# Patient Record
Sex: Male | Born: 1985 | Race: Black or African American | Hispanic: No | Marital: Single | Smoking: Current every day smoker
Health system: Southern US, Community
[De-identification: ages and names within clinical notes are randomized; demographics above are authoritative.]

## PROBLEM LIST (undated history)

## (undated) DIAGNOSIS — D573 Sickle-cell trait: Secondary | ICD-10-CM

---

## 2004-01-29 ENCOUNTER — Other Ambulatory Visit: Payer: Self-pay

## 2004-12-10 ENCOUNTER — Emergency Department: Payer: Self-pay | Admitting: Unknown Physician Specialty

## 2005-01-07 ENCOUNTER — Emergency Department: Payer: Self-pay | Admitting: Emergency Medicine

## 2005-01-14 ENCOUNTER — Emergency Department: Payer: Self-pay | Admitting: Emergency Medicine

## 2005-06-06 ENCOUNTER — Inpatient Hospital Stay: Payer: Self-pay | Admitting: Psychiatry

## 2009-03-30 ENCOUNTER — Emergency Department: Payer: Self-pay | Admitting: Emergency Medicine

## 2009-11-26 ENCOUNTER — Emergency Department: Payer: Self-pay | Admitting: Emergency Medicine

## 2013-01-15 ENCOUNTER — Emergency Department: Payer: Self-pay | Admitting: Emergency Medicine

## 2013-01-15 LAB — COMPREHENSIVE METABOLIC PANEL
Alkaline Phosphatase: 74 U/L (ref 50–136)
Anion Gap: 5 — ABNORMAL LOW (ref 7–16)
BUN: 12 mg/dL (ref 7–18)
Bilirubin,Total: 0.4 mg/dL (ref 0.2–1.0)
Calcium, Total: 8.8 mg/dL (ref 8.5–10.1)
Creatinine: 0.83 mg/dL (ref 0.60–1.30)
EGFR (African American): >60
EGFR (Non-African Amer.): >60
Glucose: 96 mg/dL (ref 65–99)
SGOT(AST): 99 U/L — ABNORMAL HIGH (ref 15–37)
Sodium: 141 mmol/L (ref 136–145)
Total Protein: 6.6 g/dL (ref 6.4–8.2)

## 2013-01-15 LAB — CBC
HCT: 37.2 % — ABNORMAL LOW (ref 40.0–52.0)
RDW: 13.8 % (ref 11.5–14.5)
WBC: 8.1 10*3/uL (ref 3.8–10.6)

## 2013-01-15 LAB — ETHANOL: Ethanol: <3 mg/dL

## 2013-01-16 LAB — URINALYSIS, COMPLETE
Bacteria: NONE SEEN
Bilirubin,UR: NEGATIVE
Blood: NEGATIVE
Glucose,UR: NEGATIVE mg/dL (ref 0–75)
Ketone: NEGATIVE
Leukocyte Esterase: NEGATIVE
Ph: 6 (ref 4.5–8.0)
Protein: NEGATIVE
Squamous Epithelial: NONE SEEN
WBC UR: <1 /HPF (ref 0–5)

## 2013-01-16 LAB — DRUG SCREEN, URINE
Amphetamines, Ur Screen: NEGATIVE (ref ?–1000)
Barbiturates, Ur Screen: NEGATIVE (ref ?–200)
Benzodiazepine, Ur Scrn: NEGATIVE (ref ?–200)
Cannabinoid 50 Ng, Ur ~~LOC~~: POSITIVE (ref ?–50)
Opiate, Ur Screen: NEGATIVE (ref ?–300)
Tricyclic, Ur Screen: NEGATIVE (ref ?–1000)

## 2013-02-07 LAB — CBC
HCT: 44.8 % (ref 40.0–52.0)
MCH: 29.6 pg (ref 26.0–34.0)
Platelet: 256 10*3/uL (ref 150–440)
RDW: 14.4 % (ref 11.5–14.5)
WBC: 12.8 10*3/uL — ABNORMAL HIGH (ref 3.8–10.6)

## 2013-02-07 LAB — COMPREHENSIVE METABOLIC PANEL
Albumin: 4.6 g/dL (ref 3.4–5.0)
Alkaline Phosphatase: 91 U/L (ref 50–136)
Anion Gap: 3 — ABNORMAL LOW (ref 7–16)
BUN: 10 mg/dL (ref 7–18)
Chloride: 107 mmol/L (ref 98–107)
Co2: 29 mmol/L (ref 21–32)
Creatinine: 1 mg/dL (ref 0.60–1.30)
EGFR (African American): >60
Glucose: 83 mg/dL (ref 65–99)
Potassium: 3.9 mmol/L (ref 3.5–5.1)
SGOT(AST): 38 U/L — ABNORMAL HIGH (ref 15–37)
SGPT (ALT): 33 U/L (ref 12–78)
Sodium: 139 mmol/L (ref 136–145)
Total Protein: 7.9 g/dL (ref 6.4–8.2)

## 2013-02-07 LAB — ETHANOL: Ethanol %: <0.003 % (ref 0.000–0.080)

## 2013-02-08 LAB — DRUG SCREEN, URINE
Amphetamines, Ur Screen: NEGATIVE (ref ?–1000)
Benzodiazepine, Ur Scrn: NEGATIVE (ref ?–200)
Cannabinoid 50 Ng, Ur ~~LOC~~: POSITIVE (ref ?–50)
Cocaine Metabolite,Ur ~~LOC~~: POSITIVE (ref ?–300)
MDMA (Ecstasy)Ur Screen: NEGATIVE (ref ?–500)
Tricyclic, Ur Screen: NEGATIVE (ref ?–1000)

## 2013-02-08 LAB — URINALYSIS, COMPLETE
Bacteria: NONE SEEN
Bilirubin,UR: NEGATIVE
Blood: NEGATIVE
Glucose,UR: NEGATIVE mg/dL (ref 0–75)
Ketone: NEGATIVE
Leukocyte Esterase: NEGATIVE
Nitrite: NEGATIVE
RBC,UR: <1 /HPF (ref 0–5)

## 2013-02-09 ENCOUNTER — Inpatient Hospital Stay: Payer: Self-pay | Admitting: Psychiatry
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
## 2017-08-01 ENCOUNTER — Encounter: Payer: Self-pay | Admitting: Emergency Medicine

## 2017-08-01 ENCOUNTER — Emergency Department
Admission: EM | Admit: 2017-08-01 | Discharge: 2017-08-01 | Disposition: A | Discharge status: Home / Self Care | Payer: No Typology Code available for payment source | Attending: Emergency Medicine | Admitting: Emergency Medicine

## 2017-08-01 ENCOUNTER — Other Ambulatory Visit: Payer: Self-pay

## 2017-08-01 DIAGNOSIS — Y939 Activity, unspecified: Secondary | ICD-10-CM | POA: Diagnosis not present

## 2017-08-01 DIAGNOSIS — Y999 Unspecified external cause status: Secondary | ICD-10-CM | POA: Insufficient documentation

## 2017-08-01 DIAGNOSIS — S52301D Unspecified fracture of shaft of right radius, subsequent encounter for closed fracture with routine healing: Secondary | ICD-10-CM | POA: Insufficient documentation

## 2017-08-01 DIAGNOSIS — Z711 Person with feared health complaint in whom no diagnosis is made: Secondary | ICD-10-CM | POA: Diagnosis not present

## 2017-08-01 DIAGNOSIS — Y929 Unspecified place or not applicable: Secondary | ICD-10-CM | POA: Diagnosis not present

## 2017-08-01 HISTORY — DX: Sickle-cell trait: D57.3

## 2017-08-01 LAB — CBC
HCT: 34.3 % — ABNORMAL LOW (ref 40.0–52.0)
Hemoglobin: 11.3 g/dL — ABNORMAL LOW (ref 13.0–18.0)
MCH: 29.6 pg (ref 26.0–34.0)
MCHC: 33.1 g/dL (ref 32.0–36.0)
MCV: 89.3 fL (ref 80.0–100.0)
Platelets: 814 10*3/uL — ABNORMAL HIGH (ref 150–440)
RBC: 3.83 MIL/uL — ABNORMAL LOW (ref 4.40–5.90)
RDW: 17.4 % — AB (ref 11.5–14.5)
WBC: 8.8 10*3/uL (ref 3.8–10.6)

## 2017-08-01 LAB — BASIC METABOLIC PANEL
Anion gap: 9 (ref 5–15)
BUN: 8 mg/dL (ref 6–20)
CHLORIDE: 103 mmol/L (ref 101–111)
CO2: 25 mmol/L (ref 22–32)
Calcium: 9.2 mg/dL (ref 8.9–10.3)
Creatinine, Ser: 0.71 mg/dL (ref 0.61–1.24)
GFR calc non Af Amer: >60 mL/min (ref >60)
Glucose, Bld: 93 mg/dL (ref 65–99)
Potassium: 4.1 mmol/L (ref 3.5–5.1)
Sodium: 137 mmol/L (ref 135–145)
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
## 2017-08-01 NOTE — Discharge Instructions (Signed)
Please make an appointment to establish care with orthopedic surgery here in [place] within the next week or so for reevaluation.  It is critically important that you bring the records from your previous hospital stay to this appointment.  Return to the emergency department for any concerns whatsoever.  It was a pleasure to take care of you today, and thank you for coming to our emergency department.  If you have any questions or concerns before leaving please ask the nurse to grab me and I'm more than happy to go through your aftercare instructions again.  If you were prescribed any opioid pain medication today such as Norco, Vicodin, Percocet, morphine, hydrocodone, or oxycodone please make sure you do not drive when you are taking this medication as it can alter your ability to drive safely.  If you have any concerns once you are home that you are not improving or are in fact getting worse before you can make it to your follow-up appointment, please do not hesitate to call 911 and come back for further evaluation.  Merrily BrittleNeil Ronne Stefanski, MD  Results for orders placed or performed during the hospital encounter of 08/01/17  CBC  Result Value Ref Range   WBC 8.8 3.8 - 10.6 K/uL   RBC 3.83 (L) 4.40 - 5.90 MIL/uL   Hemoglobin 11.3 (L) 13.0 - 18.0 g/dL   HCT 16.134.3 (L) 09.640.0 - 04.552.0 %   MCV 89.3 80.0 - 100.0 fL   MCH 29.6 26.0 - 34.0 pg   MCHC 33.1 32.0 - 36.0 g/dL   RDW 40.917.4 (H) 81.111.5 - 91.414.5 %   Platelets 814 (H) 150 - 440 K/uL  Basic metabolic panel  Result Value Ref Range   Sodium 137 135 - 145 mmol/L   Potassium 4.1 3.5 - 5.1 mmol/L   Chloride 103 101 - 111 mmol/L   CO2 25 22 - 32 mmol/L   Glucose, Bld 93 65 - 99 mg/dL   BUN 8 6 - 20 mg/dL   Creatinine, Ser 7.820.71 0.61 - 1.24 mg/dL   Calcium 9.2 8.9 - 95.610.3 mg/dL   GFR calc non Af Amer >60 >60 mL/min   GFR calc Af Amer >60 >60 mL/min   Anion gap 9 5 - 15

## 2017-08-01 NOTE — ED Notes (Signed)
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
## 2021-04-12 ENCOUNTER — Emergency Department (HOSPITAL_COMMUNITY): Payer: No Typology Code available for payment source

## 2021-04-12 ENCOUNTER — Encounter (HOSPITAL_COMMUNITY)
Admission: EM | Disposition: A | Discharge status: Home / Self Care | Payer: Self-pay | Source: Home / Self Care | Attending: Emergency Medicine

## 2021-04-12 ENCOUNTER — Observation Stay (HOSPITAL_COMMUNITY)
Admission: EM | Admit: 2021-04-12 | Discharge: 2021-04-13 | Disposition: A | Discharge status: Home / Self Care | Payer: No Typology Code available for payment source | Attending: Orthopedic Surgery | Admitting: Orthopedic Surgery

## 2021-04-12 ENCOUNTER — Encounter (HOSPITAL_COMMUNITY): Payer: Self-pay

## 2021-04-12 ENCOUNTER — Other Ambulatory Visit: Payer: Self-pay

## 2021-04-12 ENCOUNTER — Emergency Department (HOSPITAL_COMMUNITY): Payer: No Typology Code available for payment source | Admitting: Anesthesiology

## 2021-04-12 DIAGNOSIS — R4182 Altered mental status, unspecified: Secondary | ICD-10-CM | POA: Diagnosis not present

## 2021-04-12 DIAGNOSIS — Y92410 Unspecified street and highway as the place of occurrence of the external cause: Secondary | ICD-10-CM | POA: Insufficient documentation

## 2021-04-12 DIAGNOSIS — Z20822 Contact with and (suspected) exposure to covid-19: Secondary | ICD-10-CM | POA: Diagnosis not present

## 2021-04-12 DIAGNOSIS — T148XXA Other injury of unspecified body region, initial encounter: Secondary | ICD-10-CM

## 2021-04-12 DIAGNOSIS — Z23 Encounter for immunization: Secondary | ICD-10-CM | POA: Insufficient documentation

## 2021-04-12 DIAGNOSIS — S9304XA Dislocation of right ankle joint, initial encounter: Principal | ICD-10-CM | POA: Insufficient documentation

## 2021-04-12 DIAGNOSIS — T1490XA Injury, unspecified, initial encounter: Secondary | ICD-10-CM

## 2021-04-12 DIAGNOSIS — Y900 Blood alcohol level of less than 20 mg/100 ml: Secondary | ICD-10-CM | POA: Diagnosis not present

## 2021-04-12 DIAGNOSIS — S92351A Displaced fracture of fifth metatarsal bone, right foot, initial encounter for closed fracture: Secondary | ICD-10-CM

## 2021-04-12 DIAGNOSIS — R519 Headache, unspecified: Secondary | ICD-10-CM | POA: Insufficient documentation

## 2021-04-12 DIAGNOSIS — S91001A Unspecified open wound, right ankle, initial encounter: Secondary | ICD-10-CM | POA: Diagnosis present

## 2021-04-12 DIAGNOSIS — S99911A Unspecified injury of right ankle, initial encounter: Secondary | ICD-10-CM | POA: Diagnosis present

## 2021-04-12 HISTORY — PX: I & D EXTREMITY: SHX5045

## 2021-04-12 LAB — CBC
HCT: 42 % (ref 39.0–52.0)
Hemoglobin: 14.4 g/dL (ref 13.0–17.0)
MCH: 30.5 pg (ref 26.0–34.0)
MCHC: 34.3 g/dL (ref 30.0–36.0)
MCV: 89 fL (ref 80.0–100.0)
Platelets: 329 10*3/uL (ref 150–400)
RBC: 4.72 MIL/uL (ref 4.22–5.81)
RDW: 14 % (ref 11.5–15.5)
WBC: 11.4 10*3/uL — ABNORMAL HIGH (ref 4.0–10.5)
nRBC: 0 % (ref 0.0–0.2)

## 2021-04-12 LAB — I-STAT CHEM 8, ED
BUN: 13 mg/dL (ref 6–20)
Calcium, Ion: 1.02 mmol/L — ABNORMAL LOW (ref 1.15–1.40)
Chloride: 107 mmol/L (ref 98–111)
Creatinine, Ser: 1.5 mg/dL — ABNORMAL HIGH (ref 0.61–1.24)
Glucose, Bld: 118 mg/dL — ABNORMAL HIGH (ref 70–99)
HCT: 44 % (ref 39.0–52.0)
Hemoglobin: 15 g/dL (ref 13.0–17.0)
Potassium: 3.6 mmol/L (ref 3.5–5.1)
Sodium: 142 mmol/L (ref 135–145)
TCO2: 23 mmol/L (ref 22–32)

## 2021-04-12 LAB — COMPREHENSIVE METABOLIC PANEL
ALT: 121 U/L — ABNORMAL HIGH (ref 0–44)
AST: 186 U/L — ABNORMAL HIGH (ref 15–41)
Albumin: 4.4 g/dL (ref 3.5–5.0)
Alkaline Phosphatase: 86 U/L (ref 38–126)
Anion gap: 11 (ref 5–15)
BUN: 13 mg/dL (ref 6–20)
CO2: 22 mmol/L (ref 22–32)
Calcium: 9 mg/dL (ref 8.9–10.3)
Chloride: 108 mmol/L (ref 98–111)
Creatinine, Ser: 1.2 mg/dL (ref 0.61–1.24)
GFR, Estimated: >60 mL/min (ref >60)
Glucose, Bld: 121 mg/dL — ABNORMAL HIGH (ref 70–99)
Potassium: 3.6 mmol/L (ref 3.5–5.1)
Sodium: 141 mmol/L (ref 135–145)
Total Bilirubin: 0.8 mg/dL (ref 0.3–1.2)
Total Protein: 7 g/dL (ref 6.5–8.1)

## 2021-04-12 LAB — ETHANOL: Alcohol, Ethyl (B): 253 mg/dL — ABNORMAL HIGH (ref <10)

## 2021-04-12 LAB — URINALYSIS, ROUTINE W REFLEX MICROSCOPIC
Bacteria, UA: NONE SEEN
Bilirubin Urine: NEGATIVE
Glucose, UA: NEGATIVE mg/dL
Ketones, ur: NEGATIVE mg/dL
Leukocytes,Ua: NEGATIVE
Nitrite: NEGATIVE
Protein, ur: NEGATIVE mg/dL
Specific Gravity, Urine: 1.03 (ref 1.005–1.030)
pH: 5 (ref 5.0–8.0)

## 2021-04-12 LAB — PROTIME-INR
INR: 0.9 (ref 0.8–1.2)
Prothrombin Time: 12 seconds (ref 11.4–15.2)

## 2021-04-12 LAB — RESP PANEL BY RT-PCR (FLU A&B, COVID) ARPGX2
Influenza A by PCR: NEGATIVE
Influenza B by PCR: NEGATIVE
SARS Coronavirus 2 by RT PCR: NEGATIVE

## 2021-04-12 LAB — LACTIC ACID, PLASMA: Lactic Acid, Venous: 3.5 mmol/L (ref 0.5–1.9)

## 2021-04-12 LAB — SAMPLE TO BLOOD BANK

## 2021-04-12 SURGERY — IRRIGATION AND DEBRIDEMENT EXTREMITY
Anesthesia: General | Site: Ankle | Laterality: Right

## 2021-04-12 MED ORDER — CEFAZOLIN SODIUM-DEXTROSE 2-4 GM/100ML-% IV SOLN
2.0000 g | INTRAVENOUS | Status: AC
  Administered 2021-04-12: 2 g via INTRAVENOUS
  Filled 2021-04-12: qty 100

## 2021-04-12 MED ORDER — DIPHENHYDRAMINE HCL 12.5 MG/5ML PO ELIX: 12.5000 mg | ORAL_SOLUTION | ORAL | Status: DC | PRN

## 2021-04-12 MED ORDER — ROCURONIUM BROMIDE 10 MG/ML (PF) SYRINGE
PREFILLED_SYRINGE | INTRAVENOUS | Status: AC
  Filled 2021-04-12: qty 10

## 2021-04-12 MED ORDER — FENTANYL CITRATE (PF) 250 MCG/5ML IJ SOLN
INTRAMUSCULAR | Status: AC
  Filled 2021-04-12: qty 5

## 2021-04-12 MED ORDER — ONDANSETRON HCL 4 MG/2ML IJ SOLN: 4.0000 mg | Freq: Four times a day (QID) | INTRAMUSCULAR | Status: DC | PRN

## 2021-04-12 MED ORDER — SODIUM CHLORIDE 0.9 % IV SOLN: INTRAVENOUS | Status: DC

## 2021-04-12 MED ORDER — ONDANSETRON HCL 4 MG PO TABS: 4.0000 mg | ORAL_TABLET | Freq: Four times a day (QID) | ORAL | Status: DC | PRN

## 2021-04-12 MED ORDER — SUCCINYLCHOLINE CHLORIDE 200 MG/10ML IV SOSY
PREFILLED_SYRINGE | INTRAVENOUS | Status: DC | PRN
  Administered 2021-04-12: 100 mg via INTRAVENOUS

## 2021-04-12 MED ORDER — IOHEXOL 350 MG/ML SOLN
100.0000 mL | Freq: Once | INTRAVENOUS | Status: AC | PRN
  Administered 2021-04-12: 100 mL via INTRAVENOUS

## 2021-04-12 MED ORDER — HYDROMORPHONE HCL 1 MG/ML IJ SOLN: 0.2500 mg | INTRAMUSCULAR | Status: DC | PRN

## 2021-04-12 MED ORDER — ROCURONIUM BROMIDE 10 MG/ML (PF) SYRINGE
PREFILLED_SYRINGE | INTRAVENOUS | Status: DC | PRN
  Administered 2021-04-12 (×2): 20 mg via INTRAVENOUS

## 2021-04-12 MED ORDER — DOCUSATE SODIUM 100 MG PO CAPS
100.0000 mg | ORAL_CAPSULE | Freq: Two times a day (BID) | ORAL | Status: DC
  Administered 2021-04-12 – 2021-04-13 (×3): 100 mg via ORAL
  Filled 2021-04-12 (×3): qty 1

## 2021-04-12 MED ORDER — SUCCINYLCHOLINE CHLORIDE 200 MG/10ML IV SOSY
PREFILLED_SYRINGE | INTRAVENOUS | Status: AC
  Filled 2021-04-12: qty 10

## 2021-04-12 MED ORDER — KCL IN DEXTROSE-NACL 20-5-0.45 MEQ/L-%-% IV SOLN
INTRAVENOUS | Status: DC
  Filled 2021-04-12 (×2): qty 1000

## 2021-04-12 MED ORDER — LACTATED RINGERS IV SOLN: INTRAVENOUS | Status: DC | PRN

## 2021-04-12 MED ORDER — ZOLPIDEM TARTRATE 5 MG PO TABS: 5.0000 mg | ORAL_TABLET | Freq: Every evening | ORAL | Status: DC | PRN

## 2021-04-12 MED ORDER — TETANUS-DIPHTH-ACELL PERTUSSIS 5-2.5-18.5 LF-MCG/0.5 IM SUSY
0.5000 mL | PREFILLED_SYRINGE | Freq: Once | INTRAMUSCULAR | Status: AC
  Administered 2021-04-12: 0.5 mL via INTRAMUSCULAR

## 2021-04-12 MED ORDER — MIDAZOLAM HCL 2 MG/2ML IJ SOLN
INTRAMUSCULAR | Status: AC
  Filled 2021-04-12: qty 2

## 2021-04-12 MED ORDER — OXYCODONE HCL 5 MG PO TABS
10.0000 mg | ORAL_TABLET | ORAL | Status: DC | PRN
  Administered 2021-04-12: 15 mg via ORAL
  Filled 2021-04-12: qty 3

## 2021-04-12 MED ORDER — DEXAMETHASONE SODIUM PHOSPHATE 10 MG/ML IJ SOLN
INTRAMUSCULAR | Status: AC
  Filled 2021-04-12: qty 1

## 2021-04-12 MED ORDER — ONDANSETRON HCL 4 MG/2ML IJ SOLN
INTRAMUSCULAR | Status: DC | PRN
  Administered 2021-04-12: 4 mg via INTRAVENOUS

## 2021-04-12 MED ORDER — HYDROMORPHONE HCL 1 MG/ML IJ SOLN: 0.5000 mg | INTRAMUSCULAR | Status: DC | PRN

## 2021-04-12 MED ORDER — FENTANYL CITRATE (PF) 250 MCG/5ML IJ SOLN
INTRAMUSCULAR | Status: DC | PRN
  Administered 2021-04-12: 50 ug via INTRAVENOUS
  Administered 2021-04-12: 25 ug via INTRAVENOUS
  Administered 2021-04-12: 50 ug via INTRAVENOUS
  Administered 2021-04-12: 25 ug via INTRAVENOUS

## 2021-04-12 MED ORDER — PROPOFOL 10 MG/ML IV BOLUS
INTRAVENOUS | Status: DC | PRN
  Administered 2021-04-12: 100 mg via INTRAVENOUS

## 2021-04-12 MED ORDER — PROPOFOL 10 MG/ML IV BOLUS
INTRAVENOUS | Status: AC
  Filled 2021-04-12: qty 20

## 2021-04-12 MED ORDER — CEFAZOLIN SODIUM-DEXTROSE 2-4 GM/100ML-% IV SOLN
2.0000 g | Freq: Once | INTRAVENOUS | Status: AC
  Administered 2021-04-12: 2 g via INTRAVENOUS

## 2021-04-12 MED ORDER — METOCLOPRAMIDE HCL 5 MG PO TABS: 5.0000 mg | ORAL_TABLET | Freq: Three times a day (TID) | ORAL | Status: DC | PRN

## 2021-04-12 MED ORDER — 0.9 % SODIUM CHLORIDE (POUR BTL) OPTIME
TOPICAL | Status: DC | PRN
  Administered 2021-04-12: 1000 mL

## 2021-04-12 MED ORDER — METOCLOPRAMIDE HCL 5 MG/ML IJ SOLN: 5.0000 mg | Freq: Three times a day (TID) | INTRAMUSCULAR | Status: DC | PRN

## 2021-04-12 MED ORDER — LIDOCAINE 2% (20 MG/ML) 5 ML SYRINGE
INTRAMUSCULAR | Status: DC | PRN
  Administered 2021-04-12: 60 mg via INTRAVENOUS

## 2021-04-12 MED ORDER — OXYCODONE HCL 5 MG PO TABS
5.0000 mg | ORAL_TABLET | ORAL | Status: DC | PRN
  Administered 2021-04-12 – 2021-04-13 (×3): 10 mg via ORAL
  Filled 2021-04-12 (×3): qty 2

## 2021-04-12 MED ORDER — ACETAMINOPHEN 500 MG PO TABS
1000.0000 mg | ORAL_TABLET | Freq: Four times a day (QID) | ORAL | Status: AC
  Administered 2021-04-12 – 2021-04-13 (×4): 1000 mg via ORAL
  Filled 2021-04-12 (×4): qty 2

## 2021-04-12 MED ORDER — HYDROMORPHONE HCL 1 MG/ML IJ SOLN
1.0000 mg | INTRAMUSCULAR | Status: DC | PRN
Start: 2021-04-12 — End: 2021-04-12
  Administered 2021-04-12: 1 mg via INTRAVENOUS
  Filled 2021-04-12: qty 1

## 2021-04-12 MED ORDER — SUGAMMADEX SODIUM 200 MG/2ML IV SOLN
INTRAVENOUS | Status: DC | PRN
  Administered 2021-04-12: 200 mg via INTRAVENOUS

## 2021-04-12 MED ORDER — DEXAMETHASONE SODIUM PHOSPHATE 10 MG/ML IJ SOLN
INTRAMUSCULAR | Status: DC | PRN
  Administered 2021-04-12: 5 mg via INTRAVENOUS

## 2021-04-12 MED ORDER — METHOCARBAMOL 1000 MG/10ML IJ SOLN
500.0000 mg | Freq: Four times a day (QID) | INTRAVENOUS | Status: DC | PRN
  Filled 2021-04-12: qty 5

## 2021-04-12 MED ORDER — LIDOCAINE 2% (20 MG/ML) 5 ML SYRINGE
INTRAMUSCULAR | Status: AC
  Filled 2021-04-12: qty 5

## 2021-04-12 MED ORDER — CEFAZOLIN SODIUM-DEXTROSE 2-4 GM/100ML-% IV SOLN
2.0000 g | Freq: Three times a day (TID) | INTRAVENOUS | Status: AC
  Administered 2021-04-12 – 2021-04-13 (×3): 2 g via INTRAVENOUS
  Filled 2021-04-12 (×4): qty 100

## 2021-04-12 MED ORDER — ONDANSETRON HCL 4 MG/2ML IJ SOLN
INTRAMUSCULAR | Status: AC
  Filled 2021-04-12: qty 2

## 2021-04-12 MED ORDER — ACETAMINOPHEN 325 MG PO TABS: 325.0000 mg | ORAL_TABLET | Freq: Four times a day (QID) | ORAL | Status: DC | PRN

## 2021-04-12 MED ORDER — METHOCARBAMOL 500 MG PO TABS
500.0000 mg | ORAL_TABLET | Freq: Four times a day (QID) | ORAL | Status: DC | PRN
  Administered 2021-04-12 (×2): 500 mg via ORAL
  Filled 2021-04-12 (×2): qty 1

## 2021-04-12 MED ORDER — SODIUM CHLORIDE 0.9 % IR SOLN
Status: DC | PRN
  Administered 2021-04-12: 3000 mL

## 2021-04-12 SURGICAL SUPPLY — 53 items
BAG COUNTER SPONGE SURGICOUNT (BAG) IMPLANT
BLADE SURG 10 STRL SS (BLADE) ×2 IMPLANT
BNDG COHESIVE 4X5 TAN STRL (GAUZE/BANDAGES/DRESSINGS) IMPLANT
BNDG CONFORM 3 STRL LF (GAUZE/BANDAGES/DRESSINGS) IMPLANT
BNDG ELASTIC 3X5.8 VLCR STR LF (GAUZE/BANDAGES/DRESSINGS) IMPLANT
BNDG ELASTIC 4X5.8 VLCR STR LF (GAUZE/BANDAGES/DRESSINGS) ×2 IMPLANT
BNDG ELASTIC 6X15 VLCR STRL LF (GAUZE/BANDAGES/DRESSINGS) ×2 IMPLANT
BNDG ESMARK 4X9 LF (GAUZE/BANDAGES/DRESSINGS) ×2 IMPLANT
BNDG GAUZE ELAST 4 BULKY (GAUZE/BANDAGES/DRESSINGS) ×2 IMPLANT
CHLORAPREP W/TINT 26 (MISCELLANEOUS) IMPLANT
COVER SURGICAL LIGHT HANDLE (MISCELLANEOUS) ×2 IMPLANT
CUFF TOURN SGL QUICK 18X4 (TOURNIQUET CUFF) IMPLANT
CUFF TOURN SGL QUICK 34 (TOURNIQUET CUFF) ×1
CUFF TOURN SGL QUICK 42 (TOURNIQUET CUFF) IMPLANT
CUFF TRNQT CYL 34X4.125X (TOURNIQUET CUFF) ×1 IMPLANT
DRAPE SURG 17X23 STRL (DRAPES) IMPLANT
DRAPE U-SHAPE 47X51 STRL (DRAPES) ×2 IMPLANT
DRSG ADAPTIC 3X8 NADH LF (GAUZE/BANDAGES/DRESSINGS) IMPLANT
DRSG PAD ABDOMINAL 8X10 ST (GAUZE/BANDAGES/DRESSINGS) ×4 IMPLANT
DURAPREP 26ML APPLICATOR (WOUND CARE) IMPLANT
ELECT REM PT RETURN 9FT ADLT (ELECTROSURGICAL) ×2
ELECTRODE REM PT RTRN 9FT ADLT (ELECTROSURGICAL) ×1 IMPLANT
GAUZE SPONGE 4X4 12PLY STRL (GAUZE/BANDAGES/DRESSINGS) ×2 IMPLANT
GAUZE XEROFORM 5X9 LF (GAUZE/BANDAGES/DRESSINGS) ×2 IMPLANT
GLOVE SRG 8 PF TXTR STRL LF DI (GLOVE) ×1 IMPLANT
GLOVE SURG ENC MOIS LTX SZ7 (GLOVE) ×2 IMPLANT
GLOVE SURG ENC MOIS LTX SZ7.5 (GLOVE) ×2 IMPLANT
GLOVE SURG UNDER POLY LF SZ8 (GLOVE) ×1
GOWN STRL REUS W/ TWL LRG LVL3 (GOWN DISPOSABLE) ×3 IMPLANT
GOWN STRL REUS W/TWL LRG LVL3 (GOWN DISPOSABLE) ×3
HANDPIECE INTERPULSE COAX TIP (DISPOSABLE) ×1
KIT BASIN OR (CUSTOM PROCEDURE TRAY) ×2 IMPLANT
KIT TURNOVER KIT B (KITS) ×2 IMPLANT
MANIFOLD NEPTUNE II (INSTRUMENTS) ×2 IMPLANT
NS IRRIG 1000ML POUR BTL (IV SOLUTION) ×2 IMPLANT
PACK ORTHO EXTREMITY (CUSTOM PROCEDURE TRAY) ×2 IMPLANT
PAD ARMBOARD 7.5X6 YLW CONV (MISCELLANEOUS) ×4 IMPLANT
PAD CAST 4YDX4 CTTN HI CHSV (CAST SUPPLIES) ×2 IMPLANT
PADDING CAST COTTON 4X4 STRL (CAST SUPPLIES) ×2
SET HNDPC FAN SPRY TIP SCT (DISPOSABLE) ×1 IMPLANT
SPLINT PLASTER CAST XFAST 5X30 (CAST SUPPLIES) ×1 IMPLANT
SPLINT PLASTER XFAST SET 5X30 (CAST SUPPLIES) ×1
SPONGE T-LAP 18X18 ~~LOC~~+RFID (SPONGE) ×4 IMPLANT
STAPLER VISISTAT 35W (STAPLE) ×2 IMPLANT
STOCKINETTE IMPERVIOUS 9X36 MD (GAUZE/BANDAGES/DRESSINGS) ×2 IMPLANT
SUT ETHILON 2 0 FS 18 (SUTURE) ×4 IMPLANT
SUT PROLENE 3 0 PS 2 (SUTURE) IMPLANT
SWAB CULTURE ESWAB REG 1ML (MISCELLANEOUS) IMPLANT
TOWEL GREEN STERILE (TOWEL DISPOSABLE) ×2 IMPLANT
TOWEL GREEN STERILE FF (TOWEL DISPOSABLE) ×2 IMPLANT
TUBE CONNECTING 12X1/4 (SUCTIONS) ×2 IMPLANT
WATER STERILE IRR 1000ML POUR (IV SOLUTION) ×2 IMPLANT
YANKAUER SUCT BULB TIP NO VENT (SUCTIONS) ×2 IMPLANT

## 2021-04-12 NOTE — Discharge Instructions (Addendum)
Touch down weightbearing only to the right leg Do not get the leg splint wet Do not remove the leg splint Follow up in office in 10 days with Dr Ave Filter Pain medicine has been prescribed for you.  Use your medicine as needed over the first 48 hours, and then you can begin to taper your use. You may take Extra Strength Tylenol or Tylenol only in place of the pain pills.  Take aspirin 325mg  daily for 2 weeks to prevent blood clots  Please call [phone] during normal business hours or [phone] after hours for any problems. Including the following:  - excessive redness of the incisions - drainage for more than 4 days - fever of more than 101.5 F  *Please note that pain medications will not be refilled after hours or on weekends.

## 2021-04-12 NOTE — ED Notes (Signed)
Pt seen taking  BP cuff off and wrap around right leg and c collar. Pt states he is in pain, pt hollering.   Dr.Cardama at bedside.

## 2021-04-12 NOTE — ED Notes (Addendum)
Patient transported to CT with ortho tech Owen, TRN Autumn and this RN.
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
## 2021-04-12 NOTE — ED Notes (Signed)
Pt placed on c collar at this time.

## 2021-04-12 NOTE — Transfer of Care (Signed)
Immediate Anesthesia Transfer of Care Note  Patient: Gary Blanchard  Procedure(s) Performed: IRRIGATION AND DEBRIDEMENT WITH PRIMARY CLOSURE OF OPEN RIGHT ANKLE DISLOCATION, AND PLACEMENT OF SPLINT. (Right: Ankle)  Patient Location: PACU  Anesthesia Type:General  Level of Consciousness: awake, alert  and oriented  Airway & Oxygen Therapy: Patient Spontanous Breathing  Post-op Assessment: Report given to RN and Post -op Vital signs reviewed and stable  Post vital signs: Reviewed and stable  Last Vitals:  Vitals Value Taken Time  BP 145/68 04/12/21 0851  Temp    Pulse 96 04/12/21 0851  Resp 20 04/12/21 0851  SpO2 94 % 04/12/21 0851  Vitals shown include unvalidated device data.  Last Pain:  Vitals:   04/12/21 0710  TempSrc: Axillary  PainSc:          Complications: No notable events documented.
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
## 2021-04-12 NOTE — Anesthesia Preprocedure Evaluation (Addendum)
Anesthesia Evaluation  Patient identified by MRN, date of birth, ID band Patient confused  General Assessment Comment:Intoxicated  Reviewed: Allergy & Precautions, H&P , NPO status , Patient's Chart, lab work & pertinent test results  Airway Mallampati: II  TM Distance: >3 FB Neck ROM: Full    Dental no notable dental hx. (+) Poor Dentition, Dental Advisory Given   Pulmonary neg pulmonary ROS,    Pulmonary exam normal breath sounds clear to auscultation       Cardiovascular hypertension,  Rhythm:Regular Rate:Normal     Neuro/Psych negative neurological ROS  negative psych ROS   GI/Hepatic negative GI ROS, Neg liver ROS,   Endo/Other  negative endocrine ROS  Renal/GU negative Renal ROS  negative genitourinary   Musculoskeletal   Abdominal   Peds  Hematology negative hematology ROS (+)   Anesthesia Other Findings   Reproductive/Obstetrics negative OB ROS                           Anesthesia Physical Anesthesia Plan  ASA: 2 and emergent  Anesthesia Plan: General   Post-op Pain Management:    Induction: Intravenous, Rapid sequence and Cricoid pressure planned  PONV Risk Score and Plan: 3 and Ondansetron, Dexamethasone and Midazolam  Airway Management Planned: Oral ETT  Additional Equipment:   Intra-op Plan:   Post-operative Plan: Extubation in OR  Informed Consent: I have reviewed the patients History and Physical, chart, labs and discussed the procedure including the risks, benefits and alternatives for the proposed anesthesia with the patient or authorized representative who has indicated his/her understanding and acceptance.     Dental advisory given  Plan Discussed with: CRNA  Anesthesia Plan Comments:        Anesthesia Quick Evaluation
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
## 2021-04-12 NOTE — Progress Notes (Signed)
Orthopedic Tech Progress Note Patient Details:  Gary Blanchard 03-24-1986 [id number]  Ortho Devices Type of Ortho Device: Post (short leg) splint, Stirrup splint Ortho Device/Splint Location: rle. I applied splint post reduction with drs assist. Ortho Device/Splint Interventions: Ordered, Application, Adjustment   Post Interventions Patient Tolerated: Well Instructions Provided: Care of device, Adjustment of device  [name] Post 04/12/2021, 3:20 AM
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
## 2021-04-12 NOTE — Anesthesia Procedure Notes (Signed)
Procedure Name: Intubation Date/Time: 04/12/2021 7:50 AM Performed by: Dorthea Cove, CRNA Pre-anesthesia Checklist: Patient identified, Emergency Drugs available, Suction available and Patient being monitored Patient Re-evaluated:Patient Re-evaluated prior to induction Oxygen Delivery Method: Circle system utilized Preoxygenation: Pre-oxygenation with 100% oxygen Induction Type: IV induction, Cricoid Pressure applied and Rapid sequence Laryngoscope Size: Mac and 4 Grade View: Grade I Tube type: Oral Tube size: 7.5 mm Number of attempts: 1 Airway Equipment and Method: Stylet and Oral airway Placement Confirmation: ETT inserted through vocal cords under direct vision, positive ETCO2 and breath sounds checked- equal and bilateral Secured at: 21 cm Tube secured with: Tape Dental Injury: Teeth and Oropharynx as per pre-operative assessment
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
## 2021-04-12 NOTE — Anesthesia Postprocedure Evaluation (Signed)
Anesthesia Post Note  Patient: Gary Blanchard  Procedure(s) Performed: IRRIGATION AND DEBRIDEMENT WITH PRIMARY CLOSURE OF OPEN RIGHT ANKLE DISLOCATION, AND PLACEMENT OF SPLINT. (Right: Ankle)     Patient location during evaluation: PACU Anesthesia Type: General Level of consciousness: awake and alert Pain management: pain level controlled Vital Signs Assessment: post-procedure vital signs reviewed and stable Respiratory status: spontaneous breathing, nonlabored ventilation and respiratory function stable Cardiovascular status: blood pressure returned to baseline and stable Postop Assessment: no apparent nausea or vomiting Anesthetic complications: no   No notable events documented.  Last Vitals:  Vitals:   04/12/21 1017 04/12/21 1215  BP: 121/62 107/89  Pulse: 67 71  Resp: 16 17  Temp: 37.5 C 37.3 C  SpO2: 100%     Last Pain:  Vitals:   04/12/21 1215  TempSrc: Oral  PainSc:                  [name],W. EDMOND

## 2021-04-12 NOTE — ED Notes (Signed)
Back to room.

## 2021-04-13 ENCOUNTER — Other Ambulatory Visit (HOSPITAL_COMMUNITY): Payer: Self-pay

## 2021-04-13 ENCOUNTER — Encounter (HOSPITAL_COMMUNITY): Payer: Self-pay | Admitting: Orthopedic Surgery

## 2021-04-13 LAB — CBC
HCT: 34.6 % — ABNORMAL LOW (ref 39.0–52.0)
Hemoglobin: 12.1 g/dL — ABNORMAL LOW (ref 13.0–17.0)
MCH: 30.5 pg (ref 26.0–34.0)
MCHC: 35 g/dL (ref 30.0–36.0)
MCV: 87.2 fL (ref 80.0–100.0)
Platelets: 238 10*3/uL (ref 150–400)
RBC: 3.97 MIL/uL — ABNORMAL LOW (ref 4.22–5.81)
RDW: 13.7 % (ref 11.5–15.5)
WBC: 13.7 10*3/uL — ABNORMAL HIGH (ref 4.0–10.5)
nRBC: 0 % (ref 0.0–0.2)

## 2021-04-13 LAB — BASIC METABOLIC PANEL
Anion gap: 9 (ref 5–15)
BUN: 11 mg/dL (ref 6–20)
CO2: 22 mmol/L (ref 22–32)
Calcium: 8.6 mg/dL — ABNORMAL LOW (ref 8.9–10.3)
Chloride: 105 mmol/L (ref 98–111)
Creatinine, Ser: 0.93 mg/dL (ref 0.61–1.24)
GFR, Estimated: >60 mL/min (ref >60)
Glucose, Bld: 125 mg/dL — ABNORMAL HIGH (ref 70–99)
Potassium: 3.9 mmol/L (ref 3.5–5.1)
Sodium: 136 mmol/L (ref 135–145)

## 2021-04-13 MED ORDER — HYDROCODONE-ACETAMINOPHEN 5-325 MG PO TABS
1.0000 | ORAL_TABLET | Freq: Two times a day (BID) | ORAL | 0 refills | Status: AC | PRN
  Filled 2021-04-13: qty 10, 5d supply, fill #0

## 2021-04-13 MED ORDER — ASPIRIN 325 MG PO TABS
325.0000 mg | ORAL_TABLET | Freq: Every day | ORAL | 0 refills | Status: AC
  Filled 2021-04-13: qty 15, 15d supply, fill #0

## 2021-04-13 MED ORDER — ASPIRIN 325 MG PO TABS
325.0000 mg | ORAL_TABLET | Freq: Every day | ORAL | Status: DC
  Administered 2021-04-13: 325 mg via ORAL
  Filled 2021-04-13: qty 1

## 2021-04-13 NOTE — Care Management (Signed)
Script for Vicodin was sent to [facility] pharmacy, patient unable to afford. Placed patient in [facility] with no co pay. Await PT/OT evals.
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
## 2021-04-13 NOTE — Plan of Care (Signed)
  Problem: Education: Goal: Knowledge of General Education information will improve Description: Including pain rating scale, medication(s)/side effects and non-pharmacologic comfort measures Outcome: Adequate for Discharge   

## 2021-04-13 NOTE — Progress Notes (Signed)
Orthopedic Tech Progress Note Patient Details:  Gary Blanchard 06-03-1986 [id number]  RN called requesting a PAIR OF CRUTCHES for patient For discharging Ortho Devices Type of Ortho Device: Crutches Ortho Device/Splint Location: rle. I applied splint post reduction with drs assist. Ortho Device/Splint Interventions: Ordered, Adjustment   Post Interventions Patient Tolerated: Well, Ambulated well Instructions Provided: Care of device  [name] 04/13/2021, 11:58 AM

## 2021-04-13 NOTE — Progress Notes (Signed)
Discharge instructions (including medications) discussed with and copy provided to patient/caregiver 
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
## 2021-09-19 IMAGING — RF DG ANKLE 2V *R*
1 series · 2 of 2 positions shown · non-contrast
Comparison: 04/12/2021

FLUOROSCOPY TIME:  2 seconds

CLINICAL DATA: Debridement

EXAM:
RIGHT ANKLE - 2 VIEW; DG C-ARM 1-60 MIN

[Series 1: unknown protocol · 0.14mm/px · 2 of 2 slices shown]
[im 1/2]
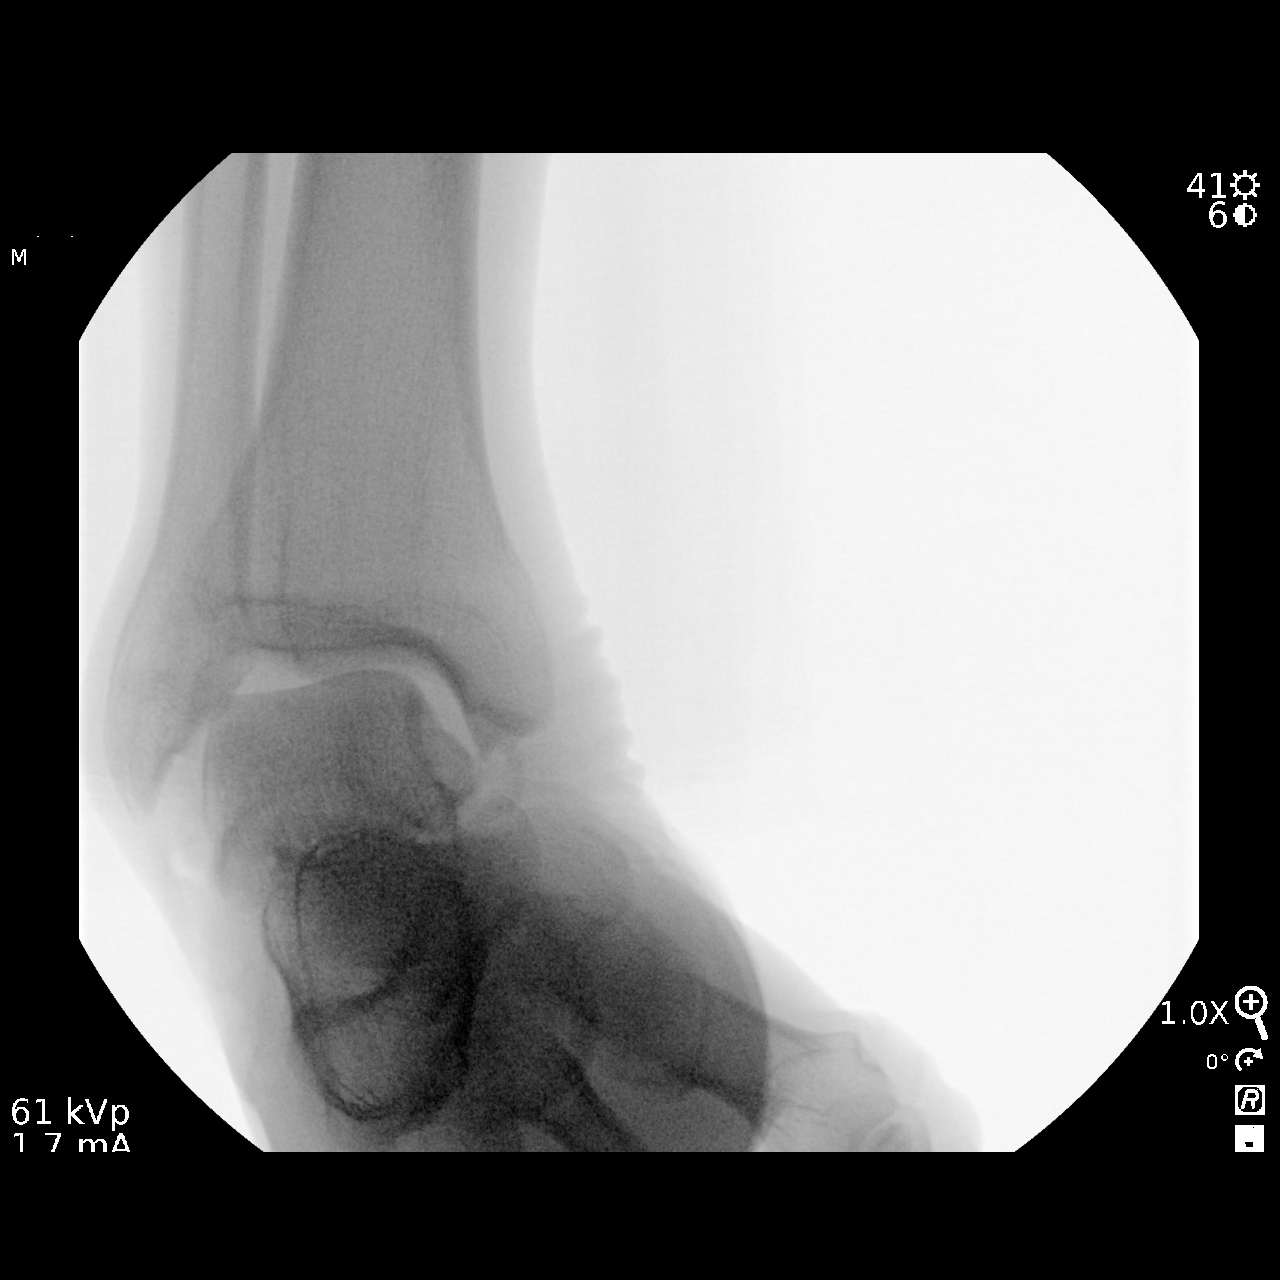
[im 2/2]
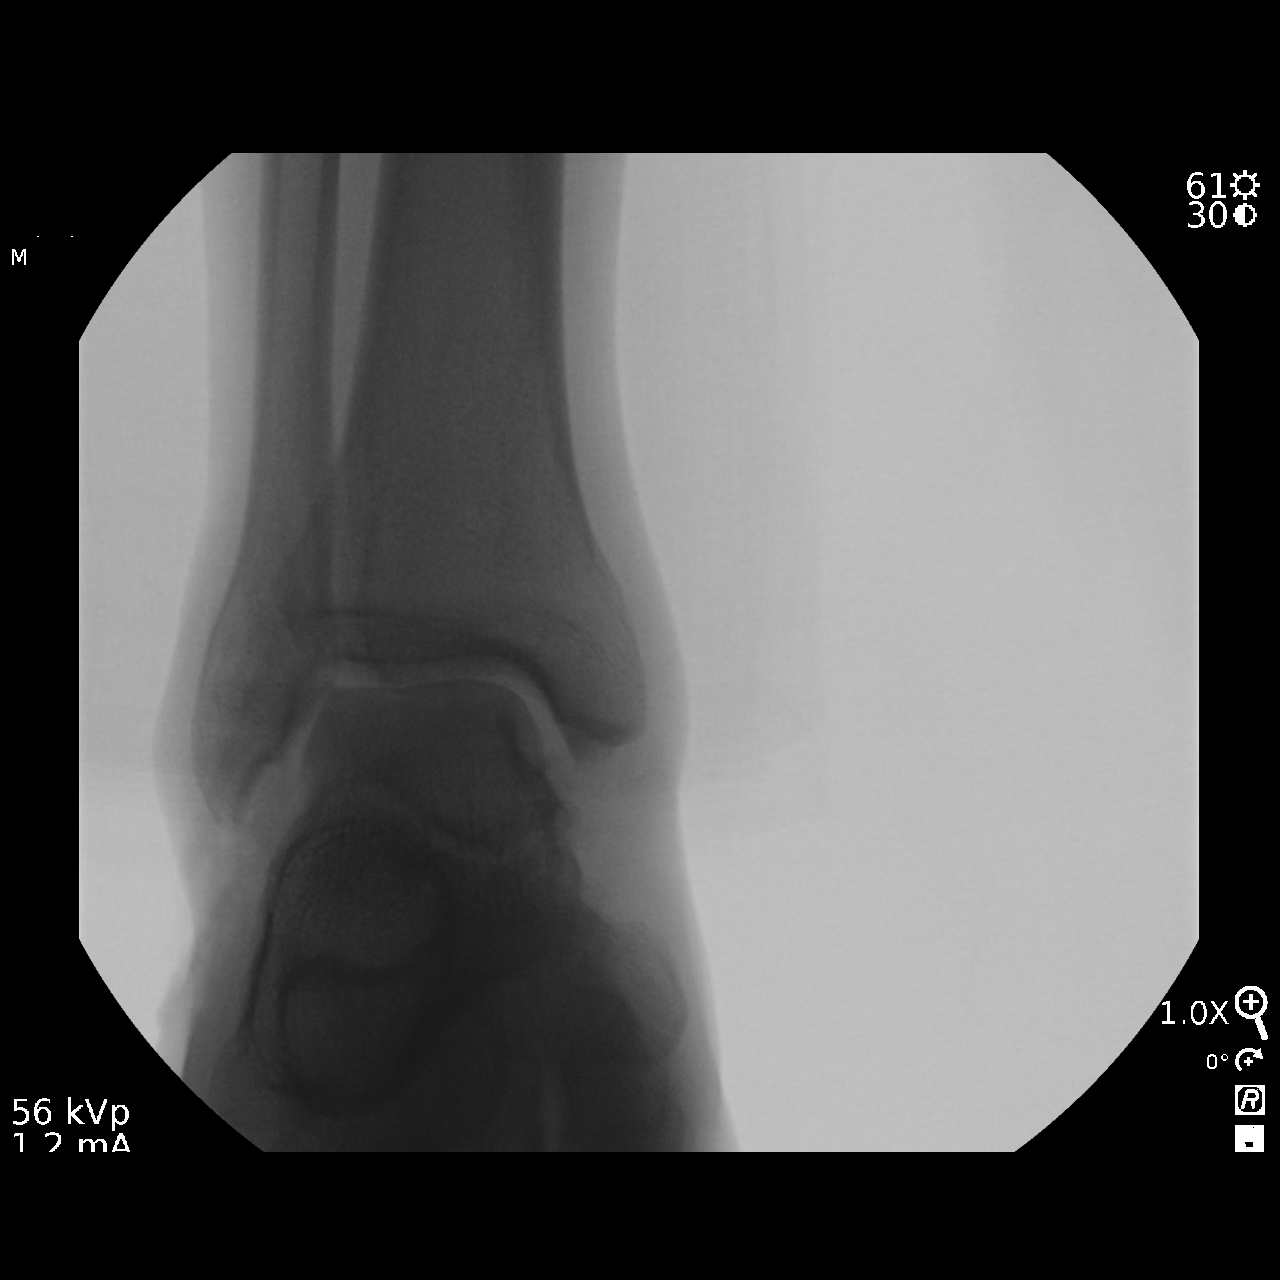

[2 of 2 positions shown; findings below may reference images not displayed]

FINDINGS: Intraoperative fluoroscopic images of the right ankle are submitted
for review. No fracture or dislocation. A fracture previously noted
of the base of the right fifth metatarsal is not well appreciated in
the provided field of view.
IMPRESSION: 1. Intraoperative fluoroscopic images of the right ankle are
submitted for review.
2. A fracture previously noted of the base of the right fifth
metatarsal is not well appreciated in the provided field of view.
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
## 2023-04-15 ENCOUNTER — Other Ambulatory Visit: Payer: Self-pay

## 2023-04-15 ENCOUNTER — Emergency Department
Admission: EM | Admit: 2023-04-15 | Discharge: 2023-04-15 | Discharge status: Against Medical Advice | Payer: Self-pay | Attending: Emergency Medicine | Admitting: Emergency Medicine

## 2023-04-15 DIAGNOSIS — Z5321 Procedure and treatment not carried out due to patient leaving prior to being seen by health care provider: Secondary | ICD-10-CM | POA: Insufficient documentation

## 2023-04-15 DIAGNOSIS — X58XXXA Exposure to other specified factors, initial encounter: Secondary | ICD-10-CM | POA: Insufficient documentation

## 2023-04-15 DIAGNOSIS — T162XXA Foreign body in left ear, initial encounter: Secondary | ICD-10-CM | POA: Insufficient documentation

## 2023-04-15 NOTE — ED Triage Notes (Signed)
Pt to ED for ear bud stuck in left ear.

## 2024-07-13 ENCOUNTER — Ambulatory Visit

## 2024-07-13 DIAGNOSIS — Z113 Encounter for screening for infections with a predominantly sexual mode of transmission: Secondary | ICD-10-CM

## 2024-07-13 LAB — HM HIV SCREENING LAB: HM HIV Screening: NEGATIVE

## 2024-07-13 LAB — HM HEPATITIS C SCREENING LAB: HM Hepatitis Screen: NEGATIVE

## 2024-07-13 NOTE — Progress Notes (Signed)
 [facility] STI clinic [address] [place] [address] Main phone: [phone]  STI screening visit  Subjective:  Gary Blanchard is a 38 y.o. male being seen today for an STI screening visit. The patient reports they do not have symptoms today.  Patient has the following medical conditions:  Patient Active Problem List   Diagnosis Date Noted   Open dislocation of ankle, right, initial encounter 04/12/2021   Chief Complaint  Patient presents with   SEXUALLY TRANSMITTED DISEASE    Pt is here for STD screening and have no symptoms   HPI Patient reports desire for STI screening. No symptoms today, but he does report the last several times he has had sex with his partner he has developed small blisters on his penis that crust over. She is his only partner, but she does have another partner. He has no symptoms today. No other symptoms, known contacts. Quit line provided for smoking cessation.  See flowsheet for further details and programmatic requirements  Hyperlink available at the top of the signed note in blue.  Flow sheet content below:  Pregnancy Intention Screening Does the patient want to become pregnant in the next year?: N/A Does the patient's partner want to become pregnant in the next year?: No Would the patient like to discuss contraceptive options today?: N/A Reason For STD Screen STD Screening: Has symptoms (sometimes has sores on penis) STD Symptoms Denies all: No Genital Itching: No Lower abdominal pain: No Discharge: No Genital ulcer / lesion: Yes Rash: No Oral / Other skin ulcer: No Pain with sex: No Sore Throat: No Visual Changes: No Risk Factors for Hep B Household, sexual, or needle sharing contact of a person infected with Hep B: No Sexual contact with a person who uses drugs not as prescribed?: No Currently or Ever used drugs not as prescribed: Yes HIV Positive: No PRep Patient: No Men who have  sex with men: No Have Hepatitis C: No History of Incarceration: No History of Homeslessness?: Yes Anal sex following anal drug use?: No Risk Factors for Hep C Currently using drugs not as prescribed: No Sexual partner(s) currently using drugs as not prescribed: No History of drug use: Yes HIV Positive: No People with a history of incarceration: No People born between the years of [birth year] and 1965: No Hepatitis Counseling Hep B Counseling: Counseled patient about increased risk of Hep B and recommendation for testing, Patient accepts testing for Hep B today Hep C Counseling: Counseled patient about increased risk of Hep C and recommendation for testing, Patient accepts testing for Hep C today  Screening for MPX risk:  Unexplained rash?  No   MSM?  No   Multiple or anonymous sex partners?  No   Any close or sexual contact with a person  diagnosed with MPX?  No   Any outside the [place]  where MPX is endemic?  No   High clinical suspicion for MPX?    -Unlikely to be chickenpox    -Lymphadenopathy    -Rash that presents in same phase of       evolution on any given body part  No   STI screening history: Last HIV test per patient/review of record was No results found for: HMHIVSCREEN No results found for: HIV  Last HEPC test per patient/review of record was No results found for: HMHEPCSCREEN No components found for: HEPC   Last HEPB test per patient/review of record was No components found for: HMHEPBSCREEN  Fertility: Does the patient or their partner desires a pregnancy in the next year? No  Immunization History  Administered Date(s) Administered   Tdap 04/12/2021    The following portions of the patient's history were reviewed and updated as appropriate: allergies, current medications, past medical history, past social history, past surgical history and problem list.  Objective:  There were no vitals filed for this visit.  Physical Exam Vitals and nursing note  reviewed.  Constitutional:      Appearance: Normal appearance.  HENT:     Head: Normocephalic and atraumatic.     Mouth/Throat:     Mouth: Mucous membranes are moist.     Pharynx: No oropharyngeal exudate or posterior oropharyngeal erythema.  Eyes:     General:        Right eye: No discharge.        Left eye: No discharge.     Conjunctiva/sclera:     Right eye: Right conjunctiva is not injected. No exudate.    Left eye: Left conjunctiva is not injected. No exudate. Pulmonary:     Effort: Pulmonary effort is normal.  Abdominal:     General: Abdomen is flat.  Genitourinary:    Comments: Declined genital exam- asymptomatic Lymphadenopathy:     Cervical: No cervical adenopathy.     Upper Body:     Right upper body: No supraclavicular or axillary adenopathy.     Left upper body: No supraclavicular or axillary adenopathy.  Skin:    General: Skin is warm and dry.  Neurological:     Mental Status: He is alert and oriented to person, place, and time.    Assessment and Plan:  [name] is a 38 y.o. male presenting to the [facility] Department for STI screening  1. Screening for venereal disease (Primary)  - HIV/HCV [facility] - HBV Antigen/Antibody [facility] - WET PREP FOR TRICH, YEAST, CLUE - Syphilis Serology, [facility] - Chlamydia/GC NAA, Confirmation  - Advised him to return to clinic when he has active sores/blisters for a herpes swab  Patient does have STI symptoms (blisters after sex a few times, none today) Patient accepted the following screenings: urine CT/GC, HIV, RPR, Hep B, and Hep C Patient meets criteria for HepB screening? Yes. Ordered? yes Patient meets criteria for HepC screening? Yes. Ordered? yes Recommended condom use with all sex Discussed importance of condom use for STI prevention  Treat positive test results per standing order. Discussed time line for [facility] results and that patient will be called with positive results and  encouraged patient to call if he had not heard in 2 weeks Recommended repeat testing in 3 months with positive results. Recommended returning for continued or worsening symptoms.   Return when blisters/sores occur for HSV testing.  No future appointments.  Damien FORBES Satchel, NP

## 2024-07-13 NOTE — Progress Notes (Signed)
 Pt is here for STD screening. Condoms declined. Sonda Primes, RN.

## 2024-07-16 LAB — CHLAMYDIA/GC NAA, CONFIRMATION
Chlamydia trachomatis, NAA: NEGATIVE
Neisseria gonorrhoeae, NAA: NEGATIVE

## 2024-07-17 LAB — HBV ANTIGEN/ANTIBODY STATE LAB
Hep B Core Total Ab: NONREACTIVE
Hepatitis B Surface Ag: NONREACTIVE

## 2024-08-09 NOTE — Addendum Note (Signed)
 Addended by: MYRNA MORT R on: 08/09/2024 02:44 PM   Modules accepted: Orders
